# Patient Record
Sex: Female | Born: 1990 | Race: White | Hispanic: No | Marital: Single | Smoking: Former smoker
Health system: Southern US, Community
[De-identification: ages and names within clinical notes are randomized; demographics above are authoritative.]

---
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
## 2010-06-10 ENCOUNTER — Emergency Department (HOSPITAL_COMMUNITY)
Admission: EM | Admit: 2010-06-10 | Discharge: 2010-06-10 | Disposition: A | Discharge status: Home / Self Care | Payer: BC Managed Care – PPO | Attending: Emergency Medicine | Admitting: Emergency Medicine

## 2010-06-10 ENCOUNTER — Emergency Department (HOSPITAL_COMMUNITY): Payer: BC Managed Care – PPO

## 2010-06-10 DIAGNOSIS — Z79899 Other long term (current) drug therapy: Secondary | ICD-10-CM | POA: Insufficient documentation

## 2010-06-10 DIAGNOSIS — R0602 Shortness of breath: Secondary | ICD-10-CM | POA: Insufficient documentation

## 2010-06-10 DIAGNOSIS — R0609 Other forms of dyspnea: Secondary | ICD-10-CM | POA: Insufficient documentation

## 2010-06-10 DIAGNOSIS — R0989 Other specified symptoms and signs involving the circulatory and respiratory systems: Secondary | ICD-10-CM | POA: Insufficient documentation

## 2010-06-10 DIAGNOSIS — R071 Chest pain on breathing: Secondary | ICD-10-CM | POA: Insufficient documentation

## 2010-06-10 LAB — POCT I-STAT, CHEM 8
BUN: 10 mg/dL (ref 6–23)
Creatinine, Ser: 0.9 mg/dL (ref 0.4–1.2)
Glucose, Bld: 77 mg/dL (ref 70–99)
Hemoglobin: 14.6 g/dL (ref 12.0–15.0)
Sodium: 140 mEq/L (ref 135–145)
TCO2: 18 mmol/L (ref 0–100)

## 2010-06-10 MED ORDER — IOHEXOL 350 MG/ML SOLN
100.0000 mL | Freq: Once | INTRAVENOUS | Status: AC | PRN
  Administered 2010-06-10: 100 mL via INTRAVENOUS

## 2010-06-25 LAB — POCT CARDIAC MARKERS: Troponin i, poc: <0.05 ng/mL (ref 0.00–0.09)

## 2010-06-25 LAB — POCT PREGNANCY, URINE: Preg Test, Ur: NEGATIVE

## 2011-12-11 IMAGING — CT CT ANGIO CHEST
2 of 7 series · 18 of 36 positions shown · IV contrast (CONTRAST)
Comparison: Radiography same day

CLINICAL DATA: Short of breath for 2 days.

CT ANGIOGRAPHY CHEST WITH CONTRAST
TECHNIQUE: Multidetector CT imaging of the chest was performed
using the standard protocol during bolus administration of
intravenous contrast.  Multiplanar CT image reconstructions
including MIPs were obtained to evaluate the vascular anatomy.
Contrast:  100 ml Omnipaque 350

[Series 5: pe thins · axial · 0.58mm/px · z∈[-254,-35]mm · 17 of 247 slices shown]
[im 14/247  lung]
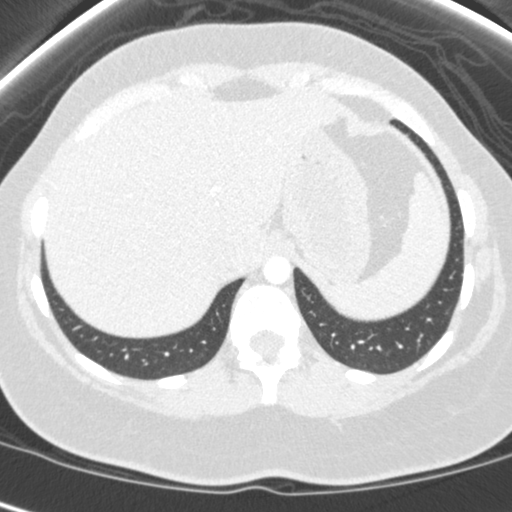
[im 28/247  mediastinal]
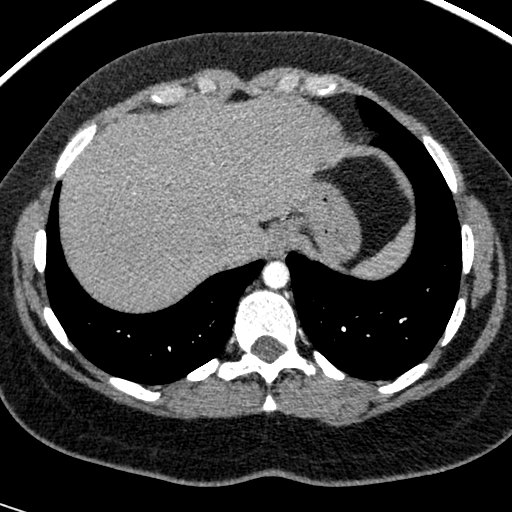
[im 42/247  lung]
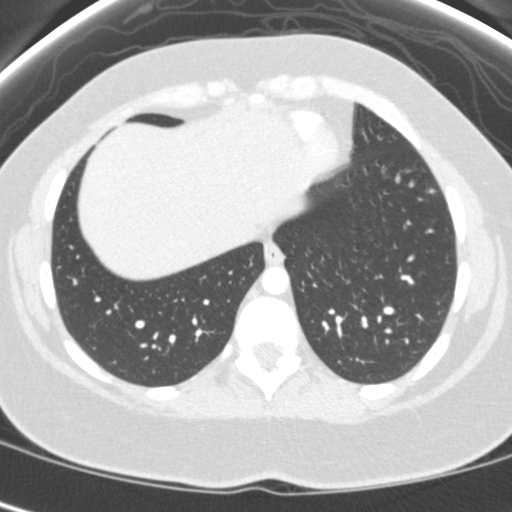
[im 55/247  mediastinal]
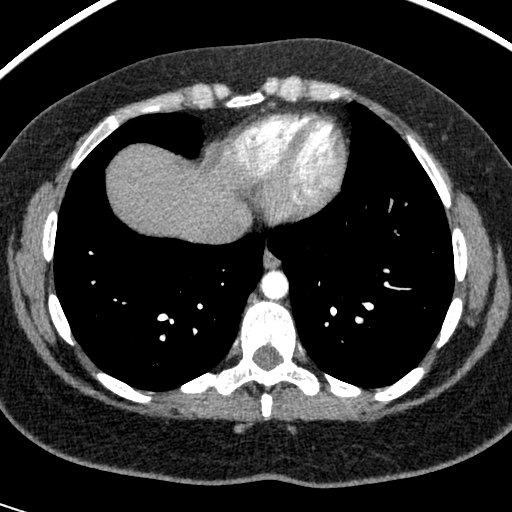
[im 69/247  lung]
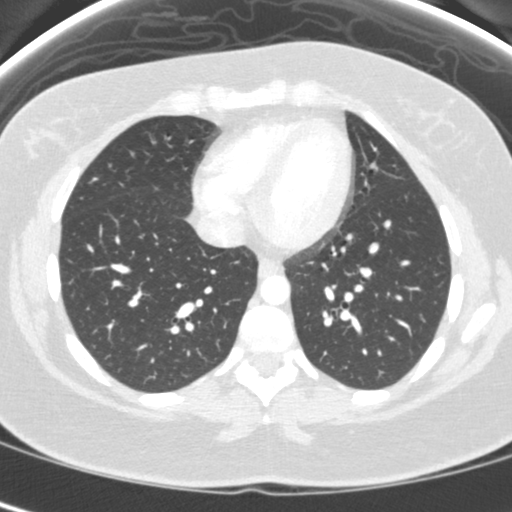
[im 83/247  mediastinal]
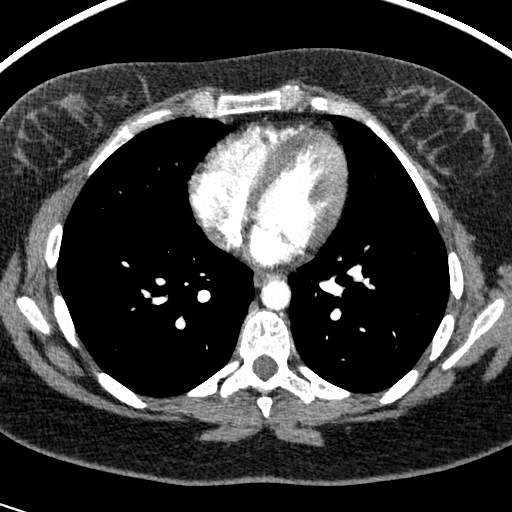
[im 96/247  lung]
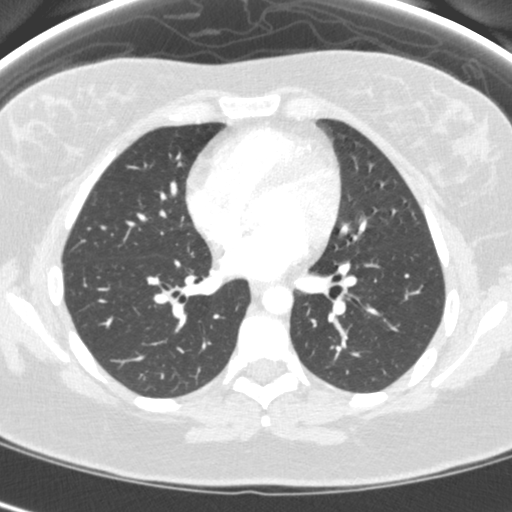
[im 110/247  mediastinal]
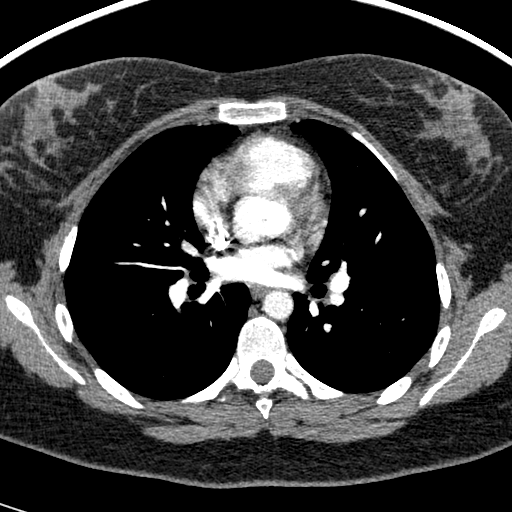
[im 124/247  lung]
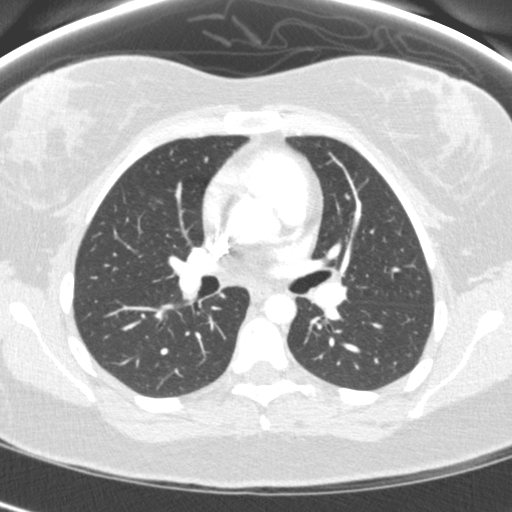
[im 137/247  mediastinal]
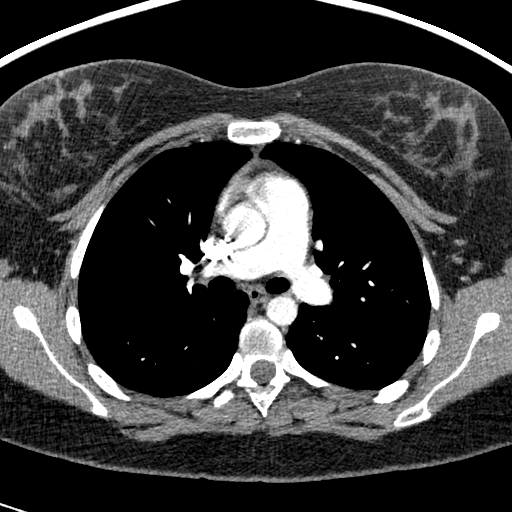
[im 151/247  lung]
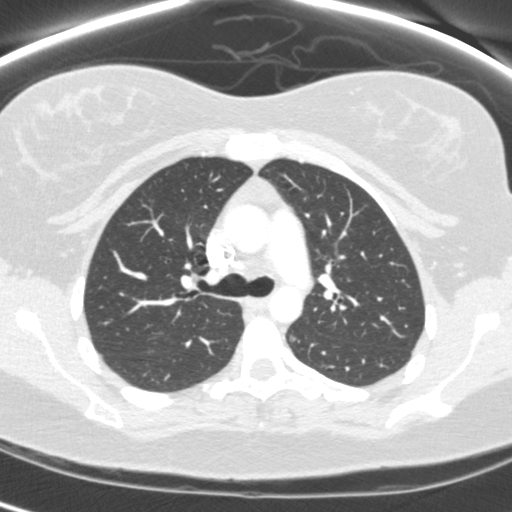
[im 165/247  mediastinal]
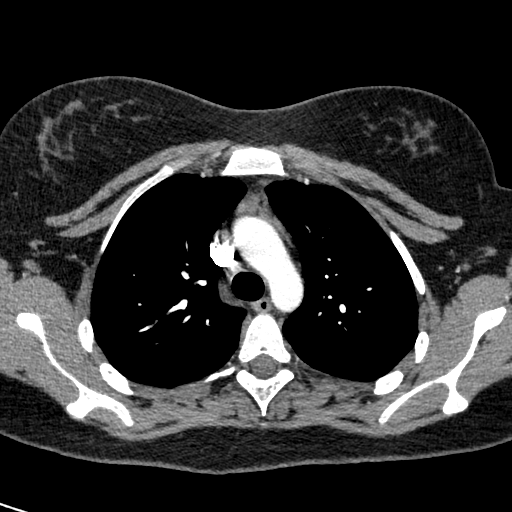
[im 178/247  lung]
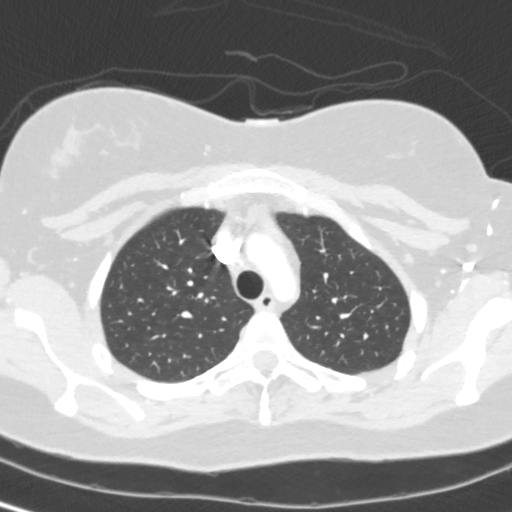
[im 192/247  mediastinal]
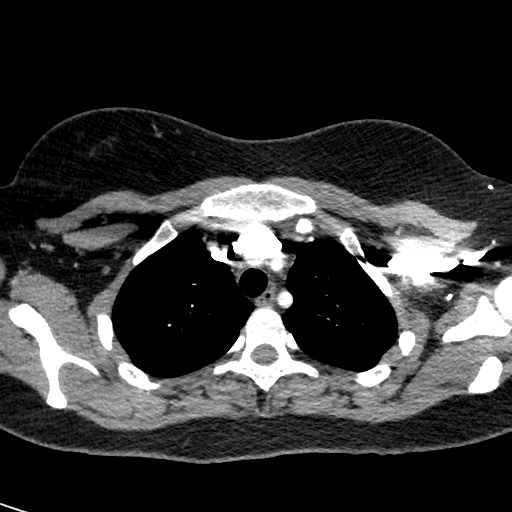
[im 206/247  lung]
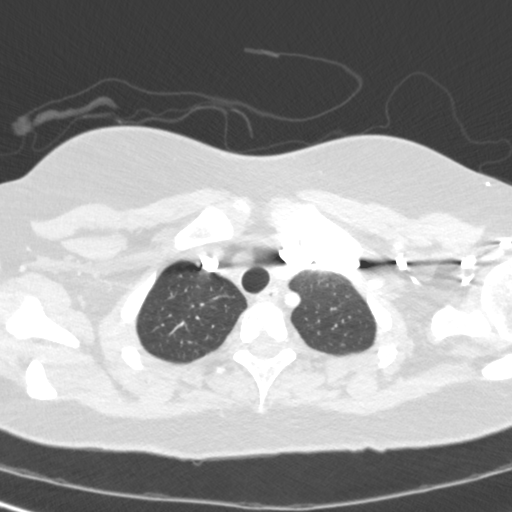
[im 219/247  mediastinal]
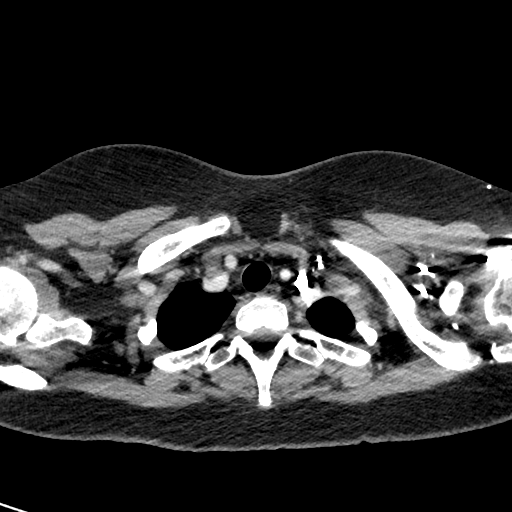
[im 233/247  lung]
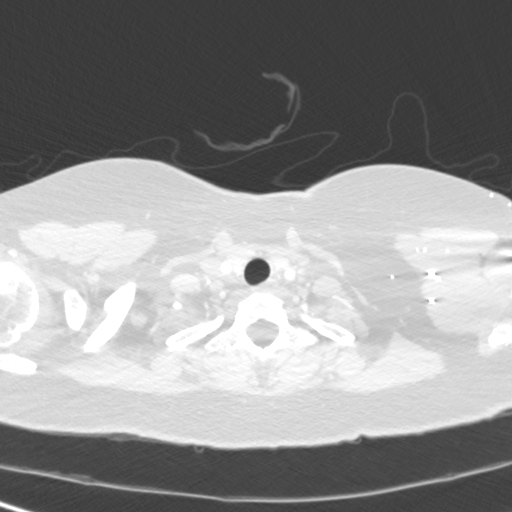

[mpr, coronals, coronal · coronal · 0.58mm/px · 1 of 91 slices shown]
[im 46/91  mediastinal]
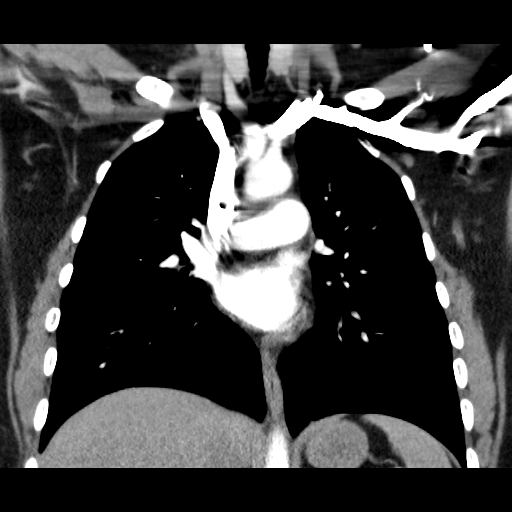

[18 of 36 positions shown; findings below may reference images not displayed]

FINDINGS: The pulmonary arterial tree is negative for emboli.  No
aortic pathology.  Mediastinal structures appear normal with normal
appearing thymic tissue.  No adenopathy.  No pleural or pericardial
fluid.  The lungs are clear.  No axillary lesion.  No chest wall
pathology evident.  Upper abdominal structures appear unremarkable.

Review of the MIP images confirms the above findings.
IMPRESSION: Normal examination.  No pulmonary emboli or other chest pathology.

## 2013-10-27 ENCOUNTER — Ambulatory Visit (INDEPENDENT_AMBULATORY_CARE_PROVIDER_SITE_OTHER): Payer: BC Managed Care – PPO | Admitting: Family Medicine

## 2013-10-27 VITALS — BP 112/88 | HR 122 | Temp 100.7°F | Resp 20 | Ht 65.0 in | Wt 222.0 lb

## 2013-10-27 DIAGNOSIS — R6883 Chills (without fever): Secondary | ICD-10-CM

## 2013-10-27 DIAGNOSIS — J028 Acute pharyngitis due to other specified organisms: Secondary | ICD-10-CM

## 2013-10-27 DIAGNOSIS — R509 Fever, unspecified: Secondary | ICD-10-CM

## 2013-10-27 DIAGNOSIS — J029 Acute pharyngitis, unspecified: Secondary | ICD-10-CM

## 2013-10-27 MED ORDER — CEFDINIR 300 MG PO CAPS: 300.0000 mg | ORAL_CAPSULE | Freq: Two times a day (BID) | ORAL | Status: AC

## 2013-10-27 NOTE — Progress Notes (Signed)
   Subjective:  This chart was scribed for Courtney SidleKurt Simrit Gohlke, MD by Carl Bestelina Holson, Medical Scribe. This patient was seen in Room 10 and the patient's care was started at 4:22 PM.   Patient ID: Courtney Lara, female    DOB: 06/02/1990, 23 y.o.   MRN: [id number]  HPI HPI Comments: Courtney Lara Febles is a 23 y.o. female who presents to the [facility] complaining of constant sore throat, chills, vomiting, and a fever of 102.1 that started three days ago.  She states that her vomit was black.  The patient states that she completed a round of Penicillin during 4th of July weekend after experiencing similar symptoms.  The patient states that the Penicillin provided temporary relief to her symptoms.  She denies being tested for strep before taking the Penicillin.  She states tat she works at [employer] in [place] [employer].    History reviewed. No pertinent past medical history. History reviewed. No pertinent past surgical history. Family History  Problem Relation Age of Onset  . Cancer Mother   . Cancer Father   . Hyperlipidemia Father   . Hypertension Father    History   Social History  . Marital Status: Single    Spouse Name: N/A    Number of Children: N/A  . Years of Education: N/A   Occupational History  . Not on file.   Social History Main Topics  . Smoking status: Former Games developermoker  . Smokeless tobacco: Not on file  . Alcohol Use: Not on file  . Drug Use: Not on file  . Sexual Activity: Not on file   Other Topics Concern  . Not on file   Social History Narrative  . No narrative on file   No Known Allergies  Review of Systems  Constitutional: Positive for fever and chills.  HENT: Positive for sore throat.   Gastrointestinal: Positive for vomiting.     Objective:  Physical Exam  Nursing note and vitals reviewed. Constitutional: She is oriented to person, place, and time. She appears well-developed and well-nourished.  HENT:  Head:  Normocephalic and atraumatic.  Mouth/Throat: Posterior oropharyngeal erythema present.  Swollen tonsils.   Eyes: EOM are normal.  Neck: Normal range of motion.  Cardiovascular: Normal rate.   Pulmonary/Chest: Effort normal.  Musculoskeletal: Normal range of motion.  Neurological: She is alert and oriented to person, place, and time.  Skin: Skin is warm and dry.  Psychiatric: She has a normal mood and affect. Her behavior is normal.      BP 112/88  Pulse 122  Temp(Src) 100.7 F (38.2 C)  Resp 20  Ht 5\' 5"  (1.651 m)  Wt 222 lb (100.699 kg)  BMI 36.94 kg/m2  SpO2 98%  LMP 09/27/2013 Assessment & Plan:    I personally performed the services described in this documentation, which was scribed in my presence. The recorded information has been reviewed and is accurate.  Acute pharyngitis due to other specified organisms - Plan: cefdinir (OMNICEF) 300 MG capsule  Chills - Plan: cefdinir (OMNICEF) 300 MG capsule  Fever, unspecified - Plan: cefdinir (OMNICEF) 300 MG capsule  Signed, Courtney SidleKurt Verdis Koval, MD

## 2013-10-27 NOTE — Patient Instructions (Signed)

## 2013-10-30 LAB — CULTURE, GROUP A STREP: Organism ID, Bacteria: NORMAL
